# Patient Record
Sex: Male | Born: 1945 | Race: White | Hispanic: No | Marital: Married
Health system: Southern US, Community
[De-identification: ages and names within clinical notes are randomized; demographics above are authoritative.]

---

## 2009-06-24 ENCOUNTER — Encounter: Admission: RE | Admit: 2009-06-24 | Discharge: 2009-06-24 | Payer: Self-pay | Admitting: Neurosurgery

## 2009-07-05 ENCOUNTER — Inpatient Hospital Stay (HOSPITAL_COMMUNITY): Admission: RE | Admit: 2009-07-05 | Discharge: 2009-07-07 | Payer: Self-pay | Admitting: Neurosurgery

## 2009-08-03 ENCOUNTER — Encounter: Admission: RE | Admit: 2009-08-03 | Discharge: 2009-08-03 | Payer: Self-pay | Admitting: Neurosurgery

## 2009-11-02 ENCOUNTER — Encounter: Admission: RE | Admit: 2009-11-02 | Discharge: 2009-11-02 | Payer: Self-pay | Admitting: Neurosurgery

## 2010-02-10 ENCOUNTER — Encounter
Admission: RE | Admit: 2010-02-10 | Discharge: 2010-02-10 | Payer: Self-pay | Source: Home / Self Care | Attending: Neurosurgery | Admitting: Neurosurgery

## 2010-05-10 LAB — BASIC METABOLIC PANEL
CO2: 26 mEq/L (ref 19–32)
GFR calc Af Amer: >60 mL/min (ref >60)
Glucose, Bld: 108 mg/dL — ABNORMAL HIGH (ref 70–99)
Potassium: 4.2 mEq/L (ref 3.5–5.1)
Sodium: 135 mEq/L (ref 135–145)

## 2010-05-10 LAB — CBC
MCHC: 34.6 g/dL (ref 30.0–36.0)
Platelets: 234 10*3/uL (ref 150–400)
RDW: 13.8 % (ref 11.5–15.5)

## 2010-05-10 LAB — TYPE AND SCREEN: ABO/RH(D): O POS

## 2010-05-10 LAB — SURGICAL PCR SCREEN: MRSA, PCR: NEGATIVE

## 2010-05-10 LAB — ABO/RH: ABO/RH(D): O POS

## 2011-03-23 DIAGNOSIS — M25562 Pain in left knee: Secondary | ICD-10-CM | POA: Insufficient documentation

## 2011-04-12 DIAGNOSIS — M1710 Unilateral primary osteoarthritis, unspecified knee: Secondary | ICD-10-CM | POA: Insufficient documentation

## 2011-04-27 DIAGNOSIS — Z96652 Presence of left artificial knee joint: Secondary | ICD-10-CM | POA: Insufficient documentation

## 2011-11-09 ENCOUNTER — Ambulatory Visit (INDEPENDENT_AMBULATORY_CARE_PROVIDER_SITE_OTHER): Payer: Self-pay | Admitting: Psychology

## 2011-11-09 DIAGNOSIS — F4320 Adjustment disorder, unspecified: Secondary | ICD-10-CM

## 2011-11-15 ENCOUNTER — Ambulatory Visit (INDEPENDENT_AMBULATORY_CARE_PROVIDER_SITE_OTHER): Payer: Self-pay | Admitting: Psychology

## 2011-11-15 DIAGNOSIS — F4320 Adjustment disorder, unspecified: Secondary | ICD-10-CM

## 2011-11-22 ENCOUNTER — Ambulatory Visit (INDEPENDENT_AMBULATORY_CARE_PROVIDER_SITE_OTHER): Payer: Self-pay | Admitting: Psychology

## 2011-11-22 DIAGNOSIS — F4320 Adjustment disorder, unspecified: Secondary | ICD-10-CM

## 2011-11-29 ENCOUNTER — Ambulatory Visit: Payer: Self-pay | Admitting: Psychology

## 2011-12-06 ENCOUNTER — Ambulatory Visit (INDEPENDENT_AMBULATORY_CARE_PROVIDER_SITE_OTHER): Payer: Self-pay | Admitting: Psychology

## 2011-12-06 DIAGNOSIS — F4320 Adjustment disorder, unspecified: Secondary | ICD-10-CM

## 2013-08-19 DIAGNOSIS — R7303 Prediabetes: Secondary | ICD-10-CM | POA: Insufficient documentation

## 2013-08-19 DIAGNOSIS — I10 Essential (primary) hypertension: Secondary | ICD-10-CM | POA: Insufficient documentation

## 2013-08-19 DIAGNOSIS — K589 Irritable bowel syndrome without diarrhea: Secondary | ICD-10-CM | POA: Insufficient documentation

## 2013-08-19 DIAGNOSIS — F419 Anxiety disorder, unspecified: Secondary | ICD-10-CM | POA: Insufficient documentation

## 2013-08-19 DIAGNOSIS — R079 Chest pain, unspecified: Secondary | ICD-10-CM | POA: Insufficient documentation

## 2013-09-07 DIAGNOSIS — M159 Polyosteoarthritis, unspecified: Secondary | ICD-10-CM | POA: Insufficient documentation

## 2013-09-07 DIAGNOSIS — F528 Other sexual dysfunction not due to a substance or known physiological condition: Secondary | ICD-10-CM | POA: Insufficient documentation

## 2013-09-07 DIAGNOSIS — L309 Dermatitis, unspecified: Secondary | ICD-10-CM | POA: Insufficient documentation

## 2013-09-07 DIAGNOSIS — N529 Male erectile dysfunction, unspecified: Secondary | ICD-10-CM | POA: Insufficient documentation

## 2013-09-07 DIAGNOSIS — K429 Umbilical hernia without obstruction or gangrene: Secondary | ICD-10-CM | POA: Insufficient documentation

## 2013-09-07 DIAGNOSIS — E785 Hyperlipidemia, unspecified: Secondary | ICD-10-CM | POA: Insufficient documentation

## 2013-09-07 DIAGNOSIS — K219 Gastro-esophageal reflux disease without esophagitis: Secondary | ICD-10-CM | POA: Insufficient documentation

## 2013-09-07 DIAGNOSIS — K5790 Diverticulosis of intestine, part unspecified, without perforation or abscess without bleeding: Secondary | ICD-10-CM | POA: Insufficient documentation

## 2013-09-07 DIAGNOSIS — L570 Actinic keratosis: Secondary | ICD-10-CM | POA: Insufficient documentation

## 2013-09-07 DIAGNOSIS — G4733 Obstructive sleep apnea (adult) (pediatric): Secondary | ICD-10-CM | POA: Insufficient documentation

## 2013-09-07 DIAGNOSIS — C4491 Basal cell carcinoma of skin, unspecified: Secondary | ICD-10-CM | POA: Insufficient documentation

## 2013-09-07 DIAGNOSIS — R42 Dizziness and giddiness: Secondary | ICD-10-CM | POA: Insufficient documentation

## 2013-09-07 DIAGNOSIS — J45909 Unspecified asthma, uncomplicated: Secondary | ICD-10-CM | POA: Insufficient documentation

## 2013-09-07 DIAGNOSIS — F4320 Adjustment disorder, unspecified: Secondary | ICD-10-CM | POA: Insufficient documentation

## 2013-09-07 DIAGNOSIS — N138 Other obstructive and reflux uropathy: Secondary | ICD-10-CM | POA: Insufficient documentation

## 2013-09-07 DIAGNOSIS — H43399 Other vitreous opacities, unspecified eye: Secondary | ICD-10-CM | POA: Insufficient documentation

## 2013-09-07 DIAGNOSIS — N401 Enlarged prostate with lower urinary tract symptoms: Secondary | ICD-10-CM

## 2013-09-07 DIAGNOSIS — M5416 Radiculopathy, lumbar region: Secondary | ICD-10-CM | POA: Insufficient documentation

## 2013-09-07 DIAGNOSIS — J309 Allergic rhinitis, unspecified: Secondary | ICD-10-CM | POA: Insufficient documentation

## 2013-09-07 DIAGNOSIS — R972 Elevated prostate specific antigen [PSA]: Secondary | ICD-10-CM | POA: Insufficient documentation

## 2015-09-15 DIAGNOSIS — R221 Localized swelling, mass and lump, neck: Secondary | ICD-10-CM | POA: Insufficient documentation

## 2016-04-11 DIAGNOSIS — H2511 Age-related nuclear cataract, right eye: Secondary | ICD-10-CM | POA: Insufficient documentation

## 2016-05-02 DIAGNOSIS — Z961 Presence of intraocular lens: Secondary | ICD-10-CM | POA: Insufficient documentation

## 2016-05-30 DIAGNOSIS — H524 Presbyopia: Secondary | ICD-10-CM | POA: Insufficient documentation

## 2016-07-25 DIAGNOSIS — M4722 Other spondylosis with radiculopathy, cervical region: Secondary | ICD-10-CM | POA: Insufficient documentation

## 2016-08-08 ENCOUNTER — Other Ambulatory Visit: Payer: Self-pay | Admitting: Neurosurgery

## 2016-08-08 DIAGNOSIS — M542 Cervicalgia: Secondary | ICD-10-CM

## 2016-08-24 ENCOUNTER — Ambulatory Visit
Admission: RE | Admit: 2016-08-24 | Discharge: 2016-08-24 | Disposition: A | Discharge status: Home / Self Care | Payer: Medicare Other | Source: Ambulatory Visit | Attending: Neurosurgery | Admitting: Neurosurgery

## 2016-08-24 DIAGNOSIS — M542 Cervicalgia: Secondary | ICD-10-CM

## 2016-09-04 ENCOUNTER — Other Ambulatory Visit: Payer: Self-pay | Admitting: Neurosurgery

## 2016-09-04 DIAGNOSIS — M542 Cervicalgia: Secondary | ICD-10-CM

## 2016-09-08 ENCOUNTER — Ambulatory Visit
Admission: RE | Admit: 2016-09-08 | Discharge: 2016-09-08 | Disposition: A | Discharge status: Home / Self Care | Payer: Medicare Other | Source: Ambulatory Visit | Attending: Neurosurgery | Admitting: Neurosurgery

## 2016-09-08 DIAGNOSIS — M542 Cervicalgia: Secondary | ICD-10-CM

## 2016-09-08 MED ORDER — IOPAMIDOL (ISOVUE-M 300) INJECTION 61%: 1.0000 mL | Freq: Once | INTRAMUSCULAR | Status: DC | PRN

## 2016-09-08 MED ORDER — TRIAMCINOLONE ACETONIDE 40 MG/ML IJ SUSP (RADIOLOGY): 60.0000 mg | Freq: Once | INTRAMUSCULAR | Status: DC

## 2016-09-08 NOTE — Discharge Instructions (Signed)

## 2016-10-26 ENCOUNTER — Other Ambulatory Visit: Payer: Self-pay | Admitting: Neurosurgery

## 2016-10-26 DIAGNOSIS — M542 Cervicalgia: Secondary | ICD-10-CM

## 2016-11-07 ENCOUNTER — Ambulatory Visit
Admission: RE | Admit: 2016-11-07 | Discharge: 2016-11-07 | Disposition: A | Discharge status: Home / Self Care | Payer: Medicare Other | Source: Ambulatory Visit | Attending: Neurosurgery | Admitting: Neurosurgery

## 2016-11-07 DIAGNOSIS — M542 Cervicalgia: Secondary | ICD-10-CM

## 2016-11-07 MED ORDER — IOPAMIDOL (ISOVUE-M 300) INJECTION 61%
1.0000 mL | Freq: Once | INTRAMUSCULAR | Status: AC | PRN
  Administered 2016-11-07: 1 mL via EPIDURAL

## 2016-11-07 MED ORDER — TRIAMCINOLONE ACETONIDE 40 MG/ML IJ SUSP (RADIOLOGY)
60.0000 mg | Freq: Once | INTRAMUSCULAR | Status: AC
  Administered 2016-11-07: 60 mg via EPIDURAL

## 2016-11-07 NOTE — Discharge Instructions (Signed)

## 2016-12-04 DIAGNOSIS — H18423 Band keratopathy, bilateral: Secondary | ICD-10-CM | POA: Insufficient documentation

## 2017-01-22 DIAGNOSIS — L02239 Carbuncle of trunk, unspecified: Secondary | ICD-10-CM | POA: Insufficient documentation

## 2017-01-22 DIAGNOSIS — E663 Overweight: Secondary | ICD-10-CM | POA: Insufficient documentation

## 2017-06-05 DIAGNOSIS — H43813 Vitreous degeneration, bilateral: Secondary | ICD-10-CM | POA: Insufficient documentation

## 2017-06-05 DIAGNOSIS — H25041 Posterior subcapsular polar age-related cataract, right eye: Secondary | ICD-10-CM | POA: Insufficient documentation

## 2017-07-11 ENCOUNTER — Other Ambulatory Visit: Payer: Self-pay | Admitting: Neurosurgery

## 2017-07-11 DIAGNOSIS — M542 Cervicalgia: Secondary | ICD-10-CM

## 2017-07-12 ENCOUNTER — Other Ambulatory Visit: Payer: Self-pay | Admitting: Neurosurgery

## 2017-07-12 DIAGNOSIS — M5481 Occipital neuralgia: Secondary | ICD-10-CM

## 2017-07-25 ENCOUNTER — Ambulatory Visit
Admission: RE | Admit: 2017-07-25 | Discharge: 2017-07-25 | Disposition: A | Discharge status: Home / Self Care | Payer: Medicare Other | Source: Ambulatory Visit | Attending: Neurosurgery | Admitting: Neurosurgery

## 2017-07-25 DIAGNOSIS — M542 Cervicalgia: Secondary | ICD-10-CM

## 2017-07-25 DIAGNOSIS — M5481 Occipital neuralgia: Secondary | ICD-10-CM

## 2017-07-25 DIAGNOSIS — Z9889 Other specified postprocedural states: Secondary | ICD-10-CM | POA: Insufficient documentation

## 2017-07-25 MED ORDER — DEXAMETHASONE SODIUM PHOSPHATE 4 MG/ML IJ SOLN
5.0000 mg | Freq: Once | INTRAMUSCULAR | Status: AC
  Administered 2017-07-25: 5.2 mg

## 2017-09-17 ENCOUNTER — Other Ambulatory Visit: Payer: Self-pay | Admitting: Student

## 2017-09-17 DIAGNOSIS — M5481 Occipital neuralgia: Secondary | ICD-10-CM

## 2017-09-28 ENCOUNTER — Ambulatory Visit
Admission: RE | Admit: 2017-09-28 | Discharge: 2017-09-28 | Disposition: A | Discharge status: Home / Self Care | Payer: Medicare Other | Source: Ambulatory Visit | Attending: Student | Admitting: Student

## 2017-09-28 DIAGNOSIS — M5481 Occipital neuralgia: Secondary | ICD-10-CM

## 2017-09-28 MED ORDER — DEXAMETHASONE SODIUM PHOSPHATE 4 MG/ML IJ SOLN
5.0000 mg | Freq: Once | INTRAMUSCULAR | Status: AC
  Administered 2017-09-28: 5.2 mg

## 2017-09-28 MED ORDER — IOPAMIDOL (ISOVUE-M 300) INJECTION 61%
15.0000 mL | Freq: Once | INTRAMUSCULAR | Status: DC | PRN
Start: 2017-09-28 — End: 2017-09-29

## 2017-09-28 NOTE — Discharge Instructions (Signed)

## 2019-03-20 ENCOUNTER — Ambulatory Visit: Payer: Medicare Other

## 2019-03-28 ENCOUNTER — Ambulatory Visit: Payer: Medicare Other

## 2019-04-10 ENCOUNTER — Ambulatory Visit: Payer: Medicare Other

## 2019-07-31 ENCOUNTER — Other Ambulatory Visit: Payer: Self-pay | Admitting: Neurosurgery

## 2019-07-31 DIAGNOSIS — M542 Cervicalgia: Secondary | ICD-10-CM

## 2019-07-31 DIAGNOSIS — R2689 Other abnormalities of gait and mobility: Secondary | ICD-10-CM

## 2019-09-05 ENCOUNTER — Ambulatory Visit
Admission: RE | Admit: 2019-09-05 | Discharge: 2019-09-05 | Disposition: A | Discharge status: Home / Self Care | Payer: Medicare Other | Source: Ambulatory Visit | Attending: Neurosurgery | Admitting: Neurosurgery

## 2019-09-05 DIAGNOSIS — R2689 Other abnormalities of gait and mobility: Secondary | ICD-10-CM

## 2019-09-05 DIAGNOSIS — M542 Cervicalgia: Secondary | ICD-10-CM

## 2021-10-26 ENCOUNTER — Ambulatory Visit (INDEPENDENT_AMBULATORY_CARE_PROVIDER_SITE_OTHER): Payer: Medicare Other | Admitting: Psychology

## 2021-10-26 DIAGNOSIS — F4322 Adjustment disorder with anxiety: Secondary | ICD-10-CM | POA: Diagnosis not present

## 2021-10-26 NOTE — Progress Notes (Signed)
[facility] Counselor Initial Adult Exam  Name: Rodney Carson. Date: 10/26/2021 MRN: [id number] DOB: 1945-11-05 PCP: Maylon Peppers, MD  Time spent: 55 minutes  Guardian/Payee:  N/A    Paperwork requested: No   Reason for Visit /Presenting Problem: anxiety associated with recent separation from wife.  Mental Status Exam: Appearance:   Casual     Behavior:  Appropriate  Motor:  Normal  Speech/Language:   Clear and Coherent  Affect:  Appropriate  Mood:  normal  Thought process:  normal  Thought content:    WNL  Sensory/Perceptual disturbances:    WNL  Orientation:  oriented to person, place, and situation  Attention:  Good  Concentration:  Good  Memory:  Remote;   [place] of knowledge:   Good  Insight:    Fair  Judgment:   Good  Impulse Control:  Good    Reported Symptoms:  anxiety, worry, agitation  Risk Assessment: Danger to Self:  No Self-injurious Behavior: No Danger to Others: No Duty to Warn: N/A Physical Aggression / Violence:No  Access to Firearms a concern:  unknown Gang Involvement:No  Patient / guardian was educated about steps to take if suicide or homicide risk level increases between visits: n/a While future psychiatric events cannot be accurately predicted, the patient does not currently require acute inpatient psychiatric care and does not currently meet [facility] involuntary commitment criteria.  Substance Abuse History: Current substance abuse: No     Past Psychiatric History:   Previous psychological history is significant for relationship discord Outpatient Providers:Annora Guderian, Ph.D. History of Psych Hospitalization: No  Psychological Testing:  none to date    Abuse History:  Victim of: No.,  N/A    Report needed: No. Victim of Neglect:No. Perpetrator of  N/A   Witness / Exposure to Domestic Violence: No   Protective Services Involvement: No  Witness to [employer] Violence:  No   Family History: No  family history on file.  Living situation: the patient lives alone  Sexual Orientation: Straight  Relationship Status: separated  Name of spouse / other:Pam If a parent, number of children / ages:3 adult children  Support Systems: friends  Financial Stress:  No   Income/Employment/Disability: Financial trader:  unknown  Educational History: Education: Scientist, product/process development: unknown  Any cultural differences that may affect / interfere with treatment:  not applicable   Recreation/Hobbies: gardening  Stressors: Marital or family conflict    Strengths: Friends  Barriers:  unknown   Legal History: Pending legal issue / charges: The patient has no significant history of legal issues. History of legal issue / charges:  N/A  Medical History/Surgical History: reviewed No past medical history on file.    Medications: Current Outpatient Medications  Medication Sig Dispense Refill   ALPRAZolam (XANAX) 0.25 MG tablet Take by mouth.     amLODipine (NORVASC) 10 MG tablet TAKE 1 TABLET BY MOUTH EVERY DAY     ammonium lactate (LAC-HYDRIN) 12 % lotion      dicyclomine (BENTYL) 20 MG tablet Take by mouth.     fluocinonide cream (LIDEX) 0.05 %      guaiFENesin (MUCINEX) 600 MG 12 hr tablet Take by mouth.     ketorolac (ACULAR) 0.5 % ophthalmic solution Instill 1 drop in the Left eye 4 times a day. Start 2 days before surgery.     lisinopril (PRINIVIL,ZESTRIL) 20 MG tablet TAKE 1 TABLET BY MOUTH EVERY DAY  prednisoLONE acetate (PRED FORTE) 1 % ophthalmic suspension Instill 1 drop in the left eye 4 times a day. Start the day AFTER surgery.     sildenafil (REVATIO) 20 MG tablet Take by mouth.     terazosin (HYTRIN) 5 MG capsule TAKE 1 CAPSULE BY MOUTH EVERY DAY     No current facility-administered medications for this visit.    No Known Allergies  Initial: Session was face to face in provider office. Patient last seen by this provider  10 years ago for relationship counseling. Says that all of his kids now live in [place] except 1 son in [place]. Has been retired since age 66 and has divorced divorced Arbie Cookey in 2012 after 85 years marriage. They stay in touch and he feels she is a terrific person. He met Pam in 2013 and married in 2016. It all "seemed fine" then he says she started to express discontent with their pre-nup. This was in 2021. In Fall 2022 he talked to her about money and he felt she became passive aggressive. In the past couple of months she declared she was leaving and moved out. When he asked why, she listed all the things he had done to "wound" her. They went to one counseling session and she dropped it. He told her he needed to arrange his estate for his kids. He was completely shocked that she left, but she was making "nasty" remarks leading up to her leaving. For the past 2 years, she complained a lot about their financial arrangement.  He says that he felt anxious when she left and he got prescription for Alprazolam (as needed). He says "he hates me" and says it is clear that it is all about money for her. He says that his anxiety comes and goes and the medicine works to resolve it. Says he has not been depressed. Drinks 2 glasses of wine/night. 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 He has been talking with a former male partner who recently lost her husband to suicide. Has been good reaching out to friends for support.   His relationship with his kids has been good. He will be going to [place] with his son for 10 days. He continues to love landscaping. 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 He is seeking counseling because of the anxiety and wants to make sure he is managing everything okay. Claims he is only seeking friendships and not looking for romance.  He wants to better  understand his triggers for when he gets anxious. Also, patient mentions that he had had some minor short term memory issues. Suggested that he get baseline testing. He will talk with his doctor about arranging testing. Wants to schedule additional sessions.       Diagnoses:  Adjustment Disorder with Anxiety.  Plan of Care: Individual outpatient psychotherapy and medication monitoring counseling   Marcelina Morel, PhD 7:30a-8:30s 60 minutes.

## 2021-11-23 ENCOUNTER — Ambulatory Visit (INDEPENDENT_AMBULATORY_CARE_PROVIDER_SITE_OTHER): Payer: Medicare Other | Admitting: Psychology

## 2021-11-23 DIAGNOSIS — F4322 Adjustment disorder with anxiety: Secondary | ICD-10-CM | POA: Diagnosis not present

## 2021-11-23 NOTE — Progress Notes (Signed)
[facility] Counselor Initial Adult Exam  Name: Rodney Carson. Date: 11/23/2021 MRN: [id number] DOB: August 13, 1945 PCP: Maylon Peppers, MD    Guardian/Payee:  N/A    Paperwork requested: No   Reason for Visit /Presenting Problem: anxiety associated with recent separation from wife.  Mental Status Exam: Appearance:   Casual     Behavior:  Appropriate  Motor:  Normal  Speech/Language:   Clear and Coherent  Affect:  Appropriate  Mood:  normal  Thought process:  normal  Thought content:    WNL  Sensory/Perceptual disturbances:    WNL  Orientation:  oriented to person, place, and situation  Attention:  Good  Concentration:  Good  Memory:  Remote;   [place] of knowledge:   Good  Insight:    Fair  Judgment:   Good  Impulse Control:  Good    Reported Symptoms:  anxiety, worry, agitation  Risk Assessment: Danger to Self:  No Self-injurious Behavior: No Danger to Others: No Duty to Warn: N/A Physical Aggression / Violence:No  Access to Firearms a concern:  unknown Gang Involvement:No  Patient / guardian was educated about steps to take if suicide or homicide risk level increases between visits: n/a While future psychiatric events cannot be accurately predicted, the patient does not currently require acute inpatient psychiatric care and does not currently meet [facility] involuntary commitment criteria.  Substance Abuse History: Current substance abuse: No     Past Psychiatric History:   Previous psychological history is significant for relationship discord Outpatient Providers:Boniface Goffe, Ph.D. History of Psych Hospitalization: No  Psychological Testing:  none to date    Abuse History:  Victim of: No.,  N/A    Report needed: No. Victim of Neglect:No. Perpetrator of  N/A   Witness / Exposure to Domestic Violence: No   Protective Services Involvement: No  Witness to [employer] Violence:  No   Family  History: No family history on file.  Living situation: the patient lives alone  Sexual Orientation: Straight  Relationship Status: separated  Name of spouse / other:Pam If a parent, number of children / ages:3 adult children  Support Systems: friends  Financial Stress:  No   Income/Employment/Disability: Financial trader:  unknown  Educational History: Education: Scientist, product/process development: unknown  Any cultural differences that may affect / interfere with treatment:  not applicable   Recreation/Hobbies: gardening  Stressors: Marital or family conflict    Strengths: Friends  Barriers:  unknown   Legal History: Pending legal issue / charges: The patient has no significant history of legal issues. History of legal issue / charges:  N/A  Medical History/Surgical History: reviewed No past medical history on file.    Medications: Current Outpatient Medications  Medication Sig Dispense Refill   ALPRAZolam (XANAX) 0.25 MG tablet Take by mouth.     amLODipine (NORVASC) 10 MG tablet TAKE 1 TABLET BY MOUTH EVERY DAY     ammonium lactate (LAC-HYDRIN) 12 % lotion      dicyclomine (BENTYL) 20 MG tablet Take by mouth.     fluocinonide cream (LIDEX) 0.05 %      guaiFENesin (MUCINEX) 600 MG 12 hr tablet Take by mouth.     ketorolac (ACULAR) 0.5 % ophthalmic solution Instill 1 drop in the Left eye 4 times a day. Start 2 days before surgery.     lisinopril (PRINIVIL,ZESTRIL) 20  MG tablet TAKE 1 TABLET BY MOUTH EVERY DAY     prednisoLONE acetate (PRED FORTE) 1 % ophthalmic suspension Instill 1 drop in the left eye 4 times a day. Start the day AFTER surgery.     sildenafil (REVATIO) 20 MG tablet Take by mouth.     terazosin (HYTRIN) 5 MG capsule TAKE 1 CAPSULE BY MOUTH EVERY DAY     No current facility-administered medications for this visit.    No Known Allergies  Session was face to face in provider office. Session note: He had the trip  to [place] with his son for 10 days. Went to [place]. While traveling, he states that his anxiety is gone. He switched to Lexapro and he is now not taking Xanax. His anxiety was most significant when he separated and says "I know it will get better with time". He has requested that Pam consider counseling. He is not interested in reconciliation, but is thinking counseling may help them be amicable.  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 He talked about his friendship with a woman in [place] who is recently widowed. He is hoping for a companion but has no interest in another marriage. He has a prior history with her. She told him she needs time and he got the message to "slow down". His social network is very small. He knows many people, but no very close friends. We talked about what he wants to do going forward. He wants to do something with and for the family. He says he doesn't "need" social interactions and prefers to focus on his relationships with schools. Told him again that we might consider neurological evaluation and gave him contact information for [facility].          Diagnoses:   Adjustment Disorder with Anxiety.  Plan of Care: Individual outpatient psychotherapy and medication monitoring counseling   Marcelina Morel, PhD 7:30a-8:30s 60 minutes.

## 2021-12-29 IMAGING — MR MR HEAD W/O CM
10 series · 47 of 48 positions shown · non-contrast
Comparison: None.

CLINICAL DATA: Posterior neck pain and headache.  Gait disturbance.

EXAM:
MRI HEAD WITHOUT CONTRAST
TECHNIQUE: Multiplanar, multiecho pulse sequences of the brain and surrounding
structures were obtained without intravenous contrast.

[Series 3: T1 · sagittal · 5.0mm · 0.45mm/px · 3 of 25 slices shown]
[im 1/25]
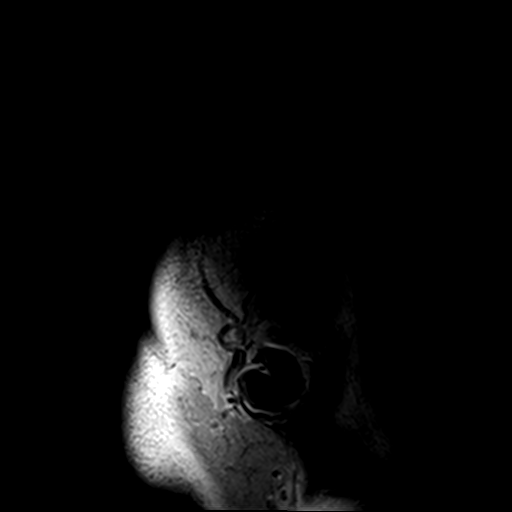
[im 13/25]
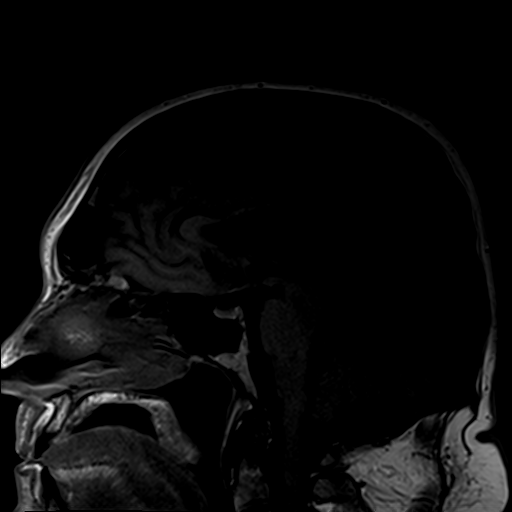
[im 25/25]
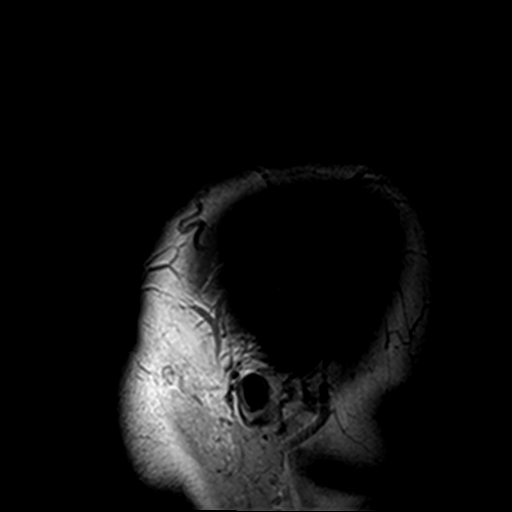

[Series 9: swi_images · axial · 2.0mm · 0.90mm/px · z∈[-57,+97]mm · 6 of 80 slices shown]
[im 1/80]
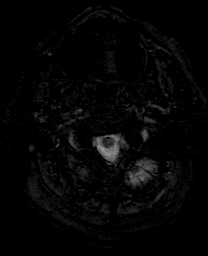
[im 16/80]
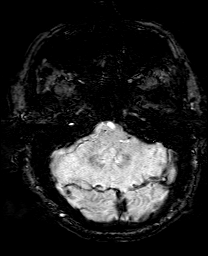
[im 32/80]
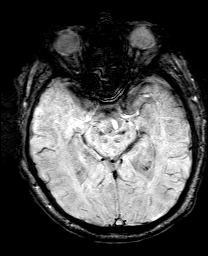
[im 48/80]
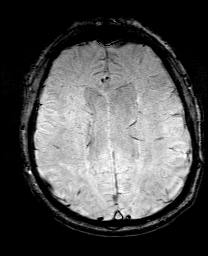
[im 64/80]
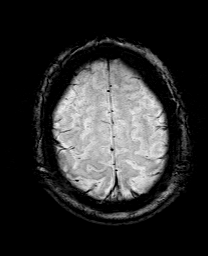
[im 80/80]
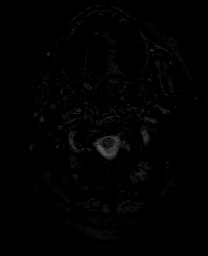

[Series 10: DWI · coronal · 5.0mm · 1.80mm/px · 6 of 74 slices shown (1 of 4)]
[im 1/74]
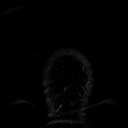
[im 15/74]
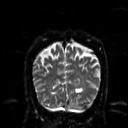
[im 30/74]
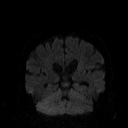
[im 44/74]
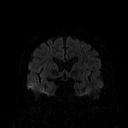
[im 59/74]
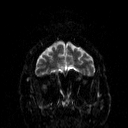
[im 74/74]
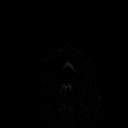

[Series 11: DWI · coronal · 5.0mm · 1.80mm/px · 3 of 37 slices shown (2 of 4)]
[im 1/37]
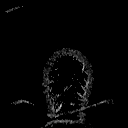
[im 19/37]
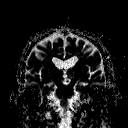
[im 37/37]
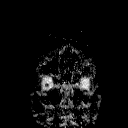

[Series 12: DWI · axial · 3.0mm · 1.80mm/px · z∈[-55,+94]mm · 8 of 104 slices shown (3 of 4)]
[im 1/104]
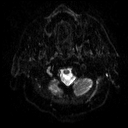
[im 15/104]
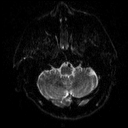
[im 30/104]
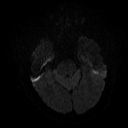
[im 45/104]
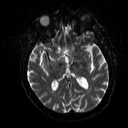
[im 59/104]
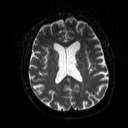
[im 74/104]
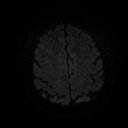
[im 89/104]
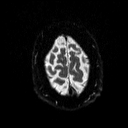
[im 104/104]
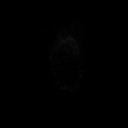

[Series 13: DWI · axial · 3.0mm · 1.80mm/px · z∈[-55,+94]mm · 4 of 52 slices shown (4 of 4)]
[im 1/52]
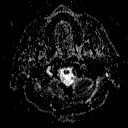
[im 18/52]
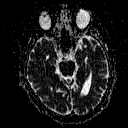
[im 35/52]
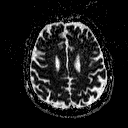
[im 52/52]
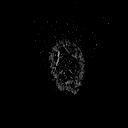

[Series 14: T2 · axial · 5.0mm · 0.60mm/px · z∈[-66,+106]mm · 2 of 27 slices shown (1 of 2)]
[im 1/27]
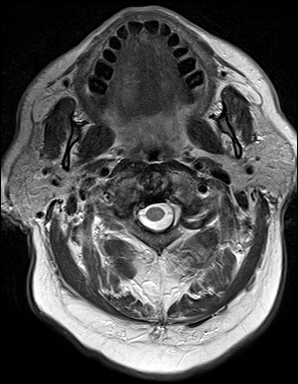
[im 27/27]
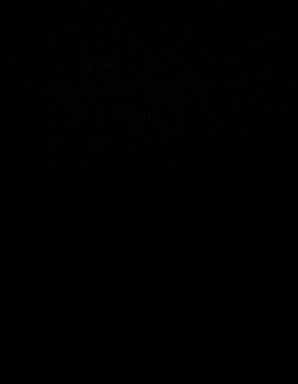

[Series 15: FLAIR · axial · 3.0mm · 0.45mm/px · z∈[-57,+96]mm · 2 of 27 slices shown]
[im 1/27]
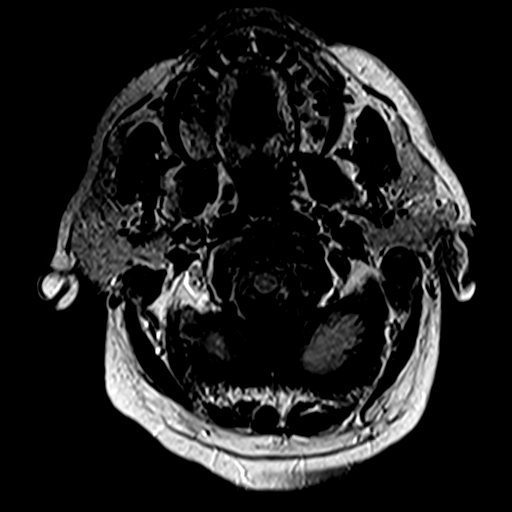
[im 27/27]
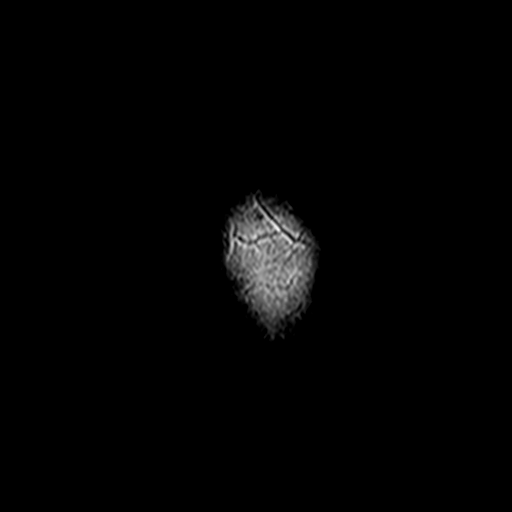

[Series 16: t1_mpr_tra copy center · axial · 1.0mm · 0.45mm/px · z∈[-58,+98]mm · 11 of 160 slices shown]
[im 1/160]
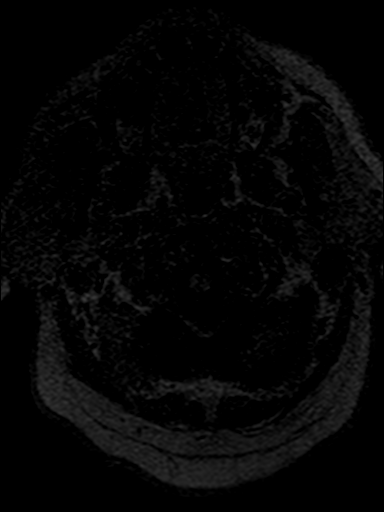
[im 15/160]
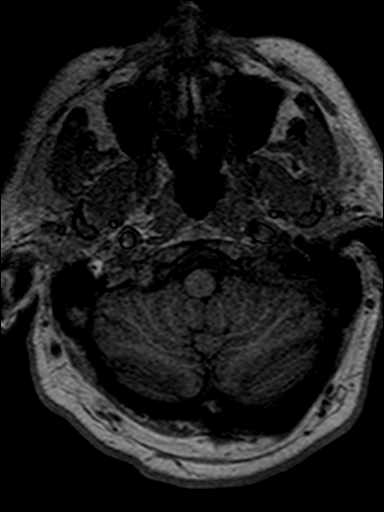
[im 29/160]
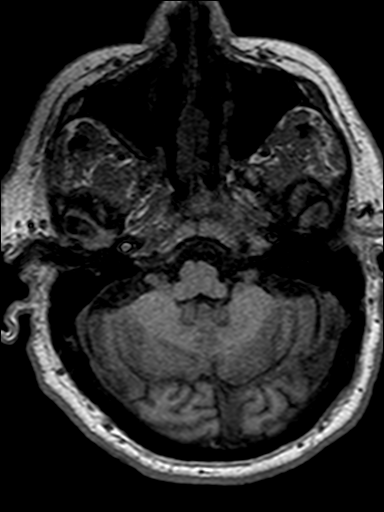
[im 44/160]
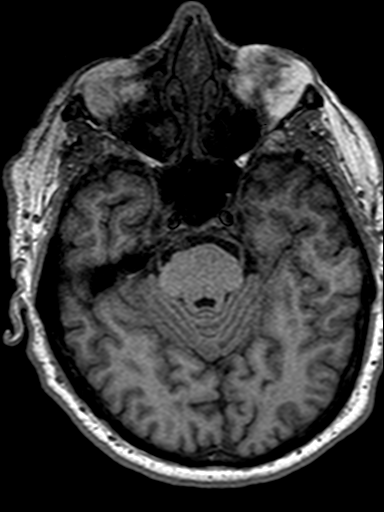
[im 58/160]
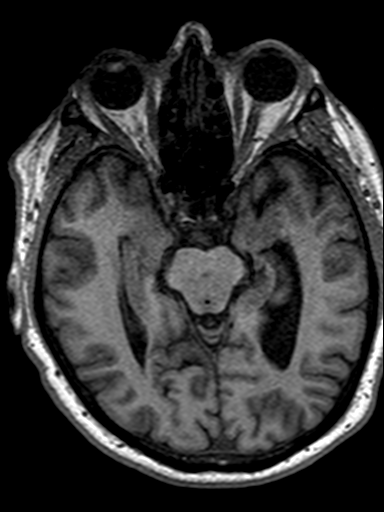
[im 73/160]
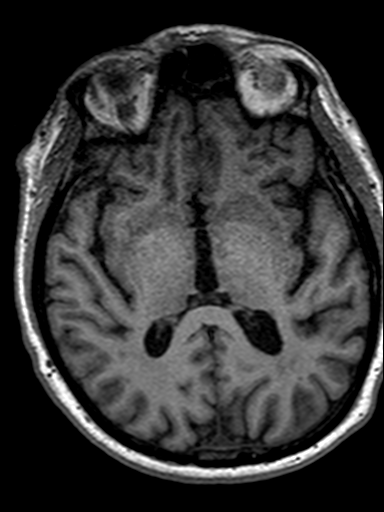
[im 87/160]
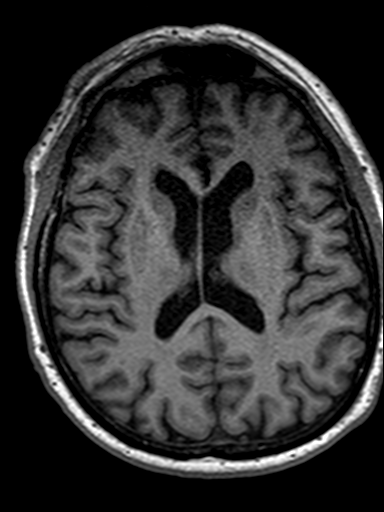
[im 102/160]
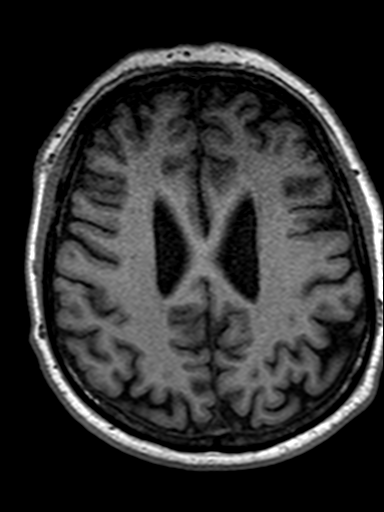
[im 116/160]
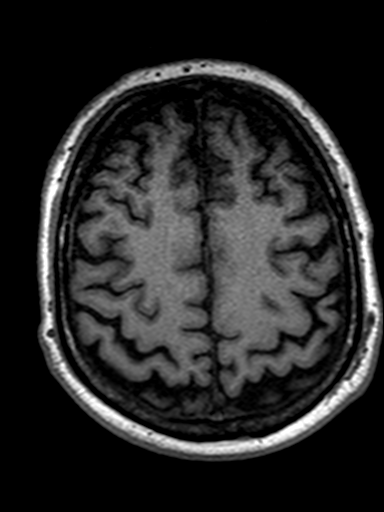
[im 131/160]
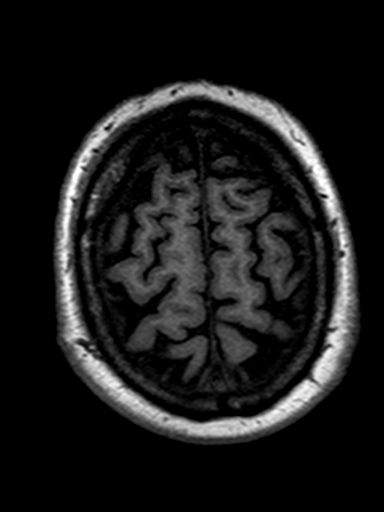
[im 160/160]
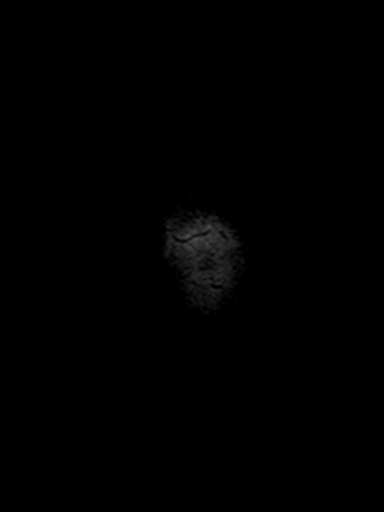

[Series 17: T2 · coronal · 5.0mm · 0.45mm/px · 2 of 30 slices shown (2 of 2)]
[im 1/30]
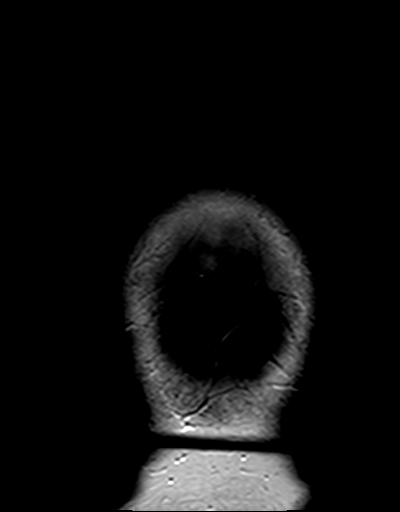
[im 30/30]
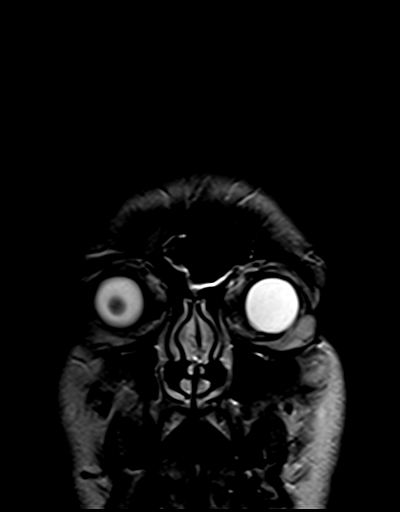

[47 of 48 positions shown; findings below may reference images not displayed]

FINDINGS: Brain: Diffusion imaging does not show any acute or subacute
infarction. The brainstem and cerebellum are normal. Cerebral
hemispheres show a few small foci of T2 and FLAIR signal consistent
with minimal small vessel change, less than often seen at this age.
No cortical or large vessel territory infarction. No mass lesion,
hemorrhage, hydrocephalus or extra-axial collection.

Vascular: Major vessels at the base of the brain show flow.

Skull and upper cervical spine: Osteoarthritis at the C1-2
articulation. See results of cervical MRI.

Sinuses/Orbits: Clear/normal.  Previous lens implant on the left.

Other: None
IMPRESSION: No acute finding. No abnormality seen to explain headache. Few
scattered small foci of T2 and FLAIR signal within the cerebral
hemispheric white matter consistent with minimal small vessel
change, less than often seen at this age.
